# Patient Record
Sex: Female | Born: 1995 | Hispanic: No | Marital: Single | Smoking: Never smoker
Health system: Southern US, Community
[De-identification: ages and names within clinical notes are randomized; demographics above are authoritative.]

## PROBLEM LIST (undated history)

## (undated) DIAGNOSIS — J45909 Unspecified asthma, uncomplicated: Secondary | ICD-10-CM

---

## 2019-08-25 ENCOUNTER — Emergency Department: Payer: PRIVATE HEALTH INSURANCE

## 2019-08-25 ENCOUNTER — Other Ambulatory Visit: Payer: Self-pay

## 2019-08-25 ENCOUNTER — Encounter: Payer: Self-pay | Admitting: Emergency Medicine

## 2019-08-25 ENCOUNTER — Ambulatory Visit (HOSPITAL_COMMUNITY): Admission: EM | Admit: 2019-08-25 | Discharge: 2019-08-25 | Discharge status: Against Medical Advice | Payer: Self-pay

## 2019-08-25 ENCOUNTER — Emergency Department
Admission: EM | Admit: 2019-08-25 | Discharge: 2019-08-25 | Disposition: A | Discharge status: Home / Self Care | Payer: PRIVATE HEALTH INSURANCE | Attending: Emergency Medicine | Admitting: Emergency Medicine

## 2019-08-25 DIAGNOSIS — J45909 Unspecified asthma, uncomplicated: Secondary | ICD-10-CM | POA: Insufficient documentation

## 2019-08-25 DIAGNOSIS — S199XXA Unspecified injury of neck, initial encounter: Secondary | ICD-10-CM | POA: Diagnosis present

## 2019-08-25 DIAGNOSIS — Y9389 Activity, other specified: Secondary | ICD-10-CM | POA: Diagnosis not present

## 2019-08-25 DIAGNOSIS — M7918 Myalgia, other site: Secondary | ICD-10-CM | POA: Diagnosis not present

## 2019-08-25 DIAGNOSIS — S161XXA Strain of muscle, fascia and tendon at neck level, initial encounter: Secondary | ICD-10-CM | POA: Insufficient documentation

## 2019-08-25 DIAGNOSIS — R519 Headache, unspecified: Secondary | ICD-10-CM | POA: Diagnosis not present

## 2019-08-25 DIAGNOSIS — Y92410 Unspecified street and highway as the place of occurrence of the external cause: Secondary | ICD-10-CM | POA: Insufficient documentation

## 2019-08-25 DIAGNOSIS — Y999 Unspecified external cause status: Secondary | ICD-10-CM | POA: Diagnosis not present

## 2019-08-25 HISTORY — DX: Unspecified asthma, uncomplicated: J45.909

## 2019-08-25 LAB — POCT PREGNANCY, URINE: Preg Test, Ur: NEGATIVE

## 2019-08-25 MED ORDER — TRAMADOL HCL 50 MG PO TABS: 50.0000 mg | ORAL_TABLET | Freq: Four times a day (QID) | ORAL | 0 refills | Status: AC | PRN

## 2019-08-25 MED ORDER — TRAMADOL HCL 50 MG PO TABS: 50.0000 mg | ORAL_TABLET | Freq: Four times a day (QID) | ORAL | 0 refills | Status: DC | PRN

## 2019-08-25 MED ORDER — IBUPROFEN 800 MG PO TABS
800.0000 mg | ORAL_TABLET | Freq: Three times a day (TID) | ORAL | 0 refills | Status: AC | PRN
Start: 2019-08-25 — End: ?

## 2019-08-25 MED ORDER — ORPHENADRINE CITRATE 30 MG/ML IJ SOLN
60.0000 mg | Freq: Two times a day (BID) | INTRAMUSCULAR | Status: DC
  Administered 2019-08-25: 60 mg via INTRAMUSCULAR
  Filled 2019-08-25: qty 2

## 2019-08-25 MED ORDER — IBUPROFEN 800 MG PO TABS
800.0000 mg | ORAL_TABLET | Freq: Three times a day (TID) | ORAL | 0 refills | Status: DC | PRN
Start: 2019-08-25 — End: 2019-08-25

## 2019-08-25 MED ORDER — CYCLOBENZAPRINE HCL 10 MG PO TABS: 10.0000 mg | ORAL_TABLET | Freq: Three times a day (TID) | ORAL | 0 refills | Status: DC | PRN

## 2019-08-25 MED ORDER — CYCLOBENZAPRINE HCL 10 MG PO TABS: 10.0000 mg | ORAL_TABLET | Freq: Three times a day (TID) | ORAL | 0 refills | Status: AC | PRN

## 2019-08-25 MED ORDER — HYDROMORPHONE HCL 1 MG/ML IJ SOLN
1.0000 mg | Freq: Once | INTRAMUSCULAR | Status: AC
  Administered 2019-08-25: 1 mg via INTRAMUSCULAR
  Filled 2019-08-25: qty 1
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
## 2019-08-25 NOTE — Discharge Instructions (Signed)
Follow discharge care instruction take medication as directed.  Be advised medication may cause drowsiness.  Use CT and x-rays findings are unremarkable.
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
